# Patient Record
Sex: Male | Born: 1995 | Race: White | Hispanic: No | Marital: Single
Health system: Southern US, Community
[De-identification: ages and names within clinical notes are randomized; demographics above are authoritative.]

---
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
## 2020-03-20 DIAGNOSIS — E663 Overweight: Secondary | ICD-10-CM | POA: Diagnosis not present

## 2020-03-20 DIAGNOSIS — Z0000 Encounter for general adult medical examination without abnormal findings: Secondary | ICD-10-CM | POA: Diagnosis not present

## 2020-03-20 DIAGNOSIS — Z1331 Encounter for screening for depression: Secondary | ICD-10-CM | POA: Diagnosis not present

## 2020-03-20 DIAGNOSIS — Z6826 Body mass index (BMI) 26.0-26.9, adult: Secondary | ICD-10-CM | POA: Diagnosis not present

## 2020-03-20 DIAGNOSIS — Z1389 Encounter for screening for other disorder: Secondary | ICD-10-CM | POA: Diagnosis not present
